# Patient Record
Sex: Female | Born: 1965 | Race: Black or African American | Hispanic: No | Marital: Single | Smoking: Never smoker
Health system: Southern US, Community
[De-identification: ages and names within clinical notes are randomized; demographics above are authoritative.]

## PROBLEM LIST (undated history)

## (undated) DIAGNOSIS — K219 Gastro-esophageal reflux disease without esophagitis: Secondary | ICD-10-CM

## (undated) DIAGNOSIS — I10 Essential (primary) hypertension: Secondary | ICD-10-CM

## (undated) DIAGNOSIS — M1990 Unspecified osteoarthritis, unspecified site: Secondary | ICD-10-CM

## (undated) DIAGNOSIS — K649 Unspecified hemorrhoids: Secondary | ICD-10-CM

## (undated) HISTORY — PX: OTHER SURGICAL HISTORY: SHX169

## (undated) HISTORY — PX: CERVICAL CERCLAGE: SHX1329

## (undated) HISTORY — PX: GASTRIC BYPASS: SHX52

---

## 2020-10-07 ENCOUNTER — Other Ambulatory Visit (HOSPITAL_COMMUNITY): Payer: Self-pay | Admitting: Family Medicine

## 2020-10-07 ENCOUNTER — Other Ambulatory Visit (HOSPITAL_COMMUNITY): Payer: Self-pay | Admitting: Internal Medicine

## 2020-10-07 DIAGNOSIS — M8580 Other specified disorders of bone density and structure, unspecified site: Secondary | ICD-10-CM

## 2020-10-07 DIAGNOSIS — Z1231 Encounter for screening mammogram for malignant neoplasm of breast: Secondary | ICD-10-CM

## 2020-10-07 DIAGNOSIS — Z780 Asymptomatic menopausal state: Secondary | ICD-10-CM

## 2020-10-15 ENCOUNTER — Ambulatory Visit (HOSPITAL_COMMUNITY)
Admission: RE | Admit: 2020-10-15 | Discharge: 2020-10-15 | Disposition: A | Discharge status: Home / Self Care | Payer: Medicare HMO | Source: Ambulatory Visit | Attending: Family Medicine | Admitting: Family Medicine

## 2020-10-15 ENCOUNTER — Other Ambulatory Visit: Payer: Self-pay

## 2020-10-15 DIAGNOSIS — Z13820 Encounter for screening for osteoporosis: Secondary | ICD-10-CM | POA: Insufficient documentation

## 2020-10-15 DIAGNOSIS — Z1231 Encounter for screening mammogram for malignant neoplasm of breast: Secondary | ICD-10-CM | POA: Diagnosis present

## 2020-10-15 DIAGNOSIS — M8580 Other specified disorders of bone density and structure, unspecified site: Secondary | ICD-10-CM

## 2020-10-15 DIAGNOSIS — M8589 Other specified disorders of bone density and structure, multiple sites: Secondary | ICD-10-CM | POA: Insufficient documentation

## 2021-04-06 ENCOUNTER — Encounter (INDEPENDENT_AMBULATORY_CARE_PROVIDER_SITE_OTHER): Payer: Self-pay | Admitting: *Deleted

## 2021-05-13 DIAGNOSIS — Z23 Encounter for immunization: Secondary | ICD-10-CM | POA: Diagnosis not present

## 2021-05-13 DIAGNOSIS — E559 Vitamin D deficiency, unspecified: Secondary | ICD-10-CM | POA: Diagnosis not present

## 2021-05-13 DIAGNOSIS — K623 Rectal prolapse: Secondary | ICD-10-CM | POA: Diagnosis not present

## 2021-05-25 DIAGNOSIS — R198 Other specified symptoms and signs involving the digestive system and abdomen: Secondary | ICD-10-CM | POA: Diagnosis not present

## 2021-05-25 DIAGNOSIS — K644 Residual hemorrhoidal skin tags: Secondary | ICD-10-CM | POA: Diagnosis not present

## 2021-05-26 DIAGNOSIS — M25562 Pain in left knee: Secondary | ICD-10-CM | POA: Diagnosis not present

## 2021-05-26 DIAGNOSIS — M25561 Pain in right knee: Secondary | ICD-10-CM | POA: Diagnosis not present

## 2021-05-26 DIAGNOSIS — G8929 Other chronic pain: Secondary | ICD-10-CM | POA: Diagnosis not present

## 2021-07-02 DIAGNOSIS — Z79891 Long term (current) use of opiate analgesic: Secondary | ICD-10-CM | POA: Diagnosis not present

## 2021-07-02 DIAGNOSIS — M5416 Radiculopathy, lumbar region: Secondary | ICD-10-CM | POA: Diagnosis not present

## 2021-07-07 DIAGNOSIS — Z9884 Bariatric surgery status: Secondary | ICD-10-CM | POA: Diagnosis not present

## 2021-07-07 DIAGNOSIS — K529 Noninfective gastroenteritis and colitis, unspecified: Secondary | ICD-10-CM | POA: Diagnosis not present

## 2021-07-07 DIAGNOSIS — K6289 Other specified diseases of anus and rectum: Secondary | ICD-10-CM | POA: Diagnosis not present

## 2021-07-07 DIAGNOSIS — K641 Second degree hemorrhoids: Secondary | ICD-10-CM | POA: Diagnosis not present

## 2021-07-16 DIAGNOSIS — R2232 Localized swelling, mass and lump, left upper limb: Secondary | ICD-10-CM | POA: Diagnosis not present

## 2021-07-16 DIAGNOSIS — R0989 Other specified symptoms and signs involving the circulatory and respiratory systems: Secondary | ICD-10-CM | POA: Diagnosis not present

## 2021-07-16 DIAGNOSIS — J069 Acute upper respiratory infection, unspecified: Secondary | ICD-10-CM | POA: Diagnosis not present

## 2021-07-16 DIAGNOSIS — Z760 Encounter for issue of repeat prescription: Secondary | ICD-10-CM | POA: Diagnosis not present

## 2021-08-11 DIAGNOSIS — H16223 Keratoconjunctivitis sicca, not specified as Sjogren's, bilateral: Secondary | ICD-10-CM | POA: Diagnosis not present

## 2021-08-11 DIAGNOSIS — H524 Presbyopia: Secondary | ICD-10-CM | POA: Diagnosis not present

## 2021-08-11 DIAGNOSIS — H2513 Age-related nuclear cataract, bilateral: Secondary | ICD-10-CM | POA: Diagnosis not present

## 2021-08-12 ENCOUNTER — Encounter (INDEPENDENT_AMBULATORY_CARE_PROVIDER_SITE_OTHER): Payer: Self-pay | Admitting: Gastroenterology

## 2021-08-12 ENCOUNTER — Telehealth (INDEPENDENT_AMBULATORY_CARE_PROVIDER_SITE_OTHER): Payer: Self-pay

## 2021-08-12 ENCOUNTER — Other Ambulatory Visit: Payer: Self-pay

## 2021-08-12 ENCOUNTER — Ambulatory Visit (INDEPENDENT_AMBULATORY_CARE_PROVIDER_SITE_OTHER): Payer: Medicare Other | Admitting: Gastroenterology

## 2021-08-12 DIAGNOSIS — K529 Noninfective gastroenteritis and colitis, unspecified: Secondary | ICD-10-CM | POA: Diagnosis not present

## 2021-08-12 DIAGNOSIS — R1013 Epigastric pain: Secondary | ICD-10-CM | POA: Diagnosis not present

## 2021-08-12 DIAGNOSIS — K860 Alcohol-induced chronic pancreatitis: Secondary | ICD-10-CM

## 2021-08-12 DIAGNOSIS — G8929 Other chronic pain: Secondary | ICD-10-CM | POA: Insufficient documentation

## 2021-08-12 DIAGNOSIS — K6289 Other specified diseases of anus and rectum: Secondary | ICD-10-CM | POA: Insufficient documentation

## 2021-08-12 DIAGNOSIS — K861 Other chronic pancreatitis: Secondary | ICD-10-CM | POA: Insufficient documentation

## 2021-08-12 MED ORDER — NA SULFATE-K SULFATE-MG SULF 17.5-3.13-1.6 GM/177ML PO SOLN: 1.0000 | ORAL | 0 refills | Status: DC

## 2021-08-12 MED ORDER — DICYCLOMINE HCL 10 MG PO CAPS: 10.0000 mg | ORAL_CAPSULE | Freq: Three times a day (TID) | ORAL | 1 refills | Status: AC | PRN

## 2021-08-12 NOTE — Telephone Encounter (Signed)
LeighAnn Kamaury Cutbirth, CMA  

## 2021-08-12 NOTE — Progress Notes (Signed)
[name], M.D. Gastroenterology & Hepatology [facility] [address] [place], [place] [address] Primary Care Physician: Vivien Rota, MD [place] [id number]  Referring MD: PCP  Chief Complaint: Diarrhea, abdominal pain and bloating.  History of Present Illness: April Kidd is a 55 y.o. female with PMH radiculopathy, obseitys /p RYGB in 2003 history of chronic pancreatitis, chronic back pain, GERD, hypertension, osteoporosis, who presents for evaluation of diarrhea, abdominal pain and bloating.  Patient is a poor historian.  She states for the last 2 years she has presented recurrent diarrhea.  Reports prior to the onset of diarrhea, she had constipation and had a bowel movement 3-4 times a week - she was taking tramadol at that time and another opiate.She reports hen her diarrhea started she was having 5-10 bowel movements per day. She states that around the same time she started recurrent episodes of abdominal cramping in her upper abdomen. She states having frequent soiling episodes while sleeping in the past which made her concerned. States that 2 months ago her diarrhea slowed down. She states that more recently she noticed she had to strain to move her bowels. Sometimes she had to do manual maneuvers to remove her stool. She was advised to start taking Miralax by her PCP , but she took it for a short time as she noticed her bowel movements were moving more frequently after this. She is currently having at least 2 bowel movements per day. Patient reports that if she eats certain type of fruits like grapes, apples, citrussy fruits, sweet food or lactose contianing food, she can have diarrhea, so she tries to avoid this type of food.She has not presented any more episodes of fecal incontinence. She states that her most significant symptom of the moment is that she is currently presenting  recurrent pain in the rectal area, which improves with the intake of tramadol,  gabapentin and tylenol. The patient states at least a year ago she was presenting pressure in the rectum but these became more severe as she has presented pain.   Patient reports that she was vomiting frequently but this has improved recently.  She is still having some episodes of abdominal cramping in the epigastric area. Does not take any medications for this.  Patient states she was on ibuprofen a a couple of months ago but has not tried it recently.  The patient denies having any fever, chills, hematochezia, melena, hematemesis, jaundice, pruritus. Patient went down to 198 lb but gained some lb recently.  Only, as part of the investigation she had performed in the past for her symptoms she underwent CT abdomen pelvis with IV contrast on 11/22/2020 which showed the following abnormalities 1. Hazy stranding involving the subcutaneous fat of the lower  ventral pelvis, of uncertain significance, but could reflect changes  of acute panniculitis. Correlation with physical exam and any  potential symptomatology recommended. No loculated collections.  2. No other acute intra-abdominal or pelvic process.  3. 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  4. Sequelae of chronic pancreatitis.  5. Status post cholecystectomy. Mild intra and extrahepatic biliary  dilatation could be related to post cholecystectomy changes.  Correlation with LFTs suggested.    Last EGD:2003 -does not recall findings. Last Colonoscopy: 2018 - patient reports it was normal, performed at [place], no records available  FHx: neg for any gastrointestinal/liver disease, mother had cancer but unknown type Social: neg smoking, or illicit  drug use. Used to drink heavily yuntul 2013, drank liquor 5 drinks per day Surgical: RYGB, c section  Past Medical History:History reviewed. No pertinent past medical  history.  Past Surgical History:History reviewed. No pertinent surgical history.  Family History:History reviewed. No pertinent family history.  Social History: Social History   Tobacco Use  Smoking Status Never  Smokeless Tobacco Never   Social History   Substance and Sexual Activity  Alcohol Use Not Currently   Social History   Substance and Sexual Activity  Drug Use Never    Allergies: Allergies  Allergen Reactions   Bactrim [Sulfamethoxazole-Trimethoprim] Shortness Of Breath   Shellfish Allergy Anaphylaxis   Iodides Itching   Latex Itching   Morphine And Related Itching   Sulfa Antibiotics Itching    Medications: Current Outpatient Medications  Medication Sig Dispense Refill   alendronate (FOSAMAX) 70 MG tablet Take 70 mg by mouth once a week. Take with a full glass of water on an empty stomach.     amLODipine (NORVASC) 5 MG tablet Take 5 mg by mouth daily.     aspirin EC 81 MG tablet Take 81 mg by mouth daily. Swallow whole.     atorvastatin (LIPITOR) 20 MG tablet Take 20 mg by mouth at bedtime.     cholecalciferol (VITAMIN D3) 25 MCG (1000 UNIT) tablet Take 1,000 Units by mouth daily.     clotrimazole (LOTRIMIN) 1 % cream Apply 1 application topically 2 (two) times daily.     conjugated estrogens (PREMARIN) vaginal cream Place 1 applicator vaginally. Two times per week.     cyclobenzaprine (FLEXERIL) 5 MG tablet Take 5 mg by mouth 3 (three) times daily as needed for muscle spasms.     gabapentin (NEURONTIN) 300 MG capsule Take 300 mg by mouth 3 (three) times daily.     metoprolol tartrate (LOPRESSOR) 25 MG tablet Take 25 mg by mouth 2 (two) times daily.     omeprazole (PRILOSEC) 20 MG capsule Take 20 mg by mouth daily.     traMADol (ULTRAM) 50 MG tablet Take by mouth 4 (four) times daily.     valACYclovir (VALTREX) 500 MG tablet Take 500 mg by mouth as needed.     No current facility-administered medications for this visit.    Review of Systems: GENERAL:  negative for malaise, night sweats HEENT: No changes in hearing or vision, no nose bleeds or other nasal problems. NECK: Negative for lumps, goiter, pain and significant neck swelling RESPIRATORY: Negative for cough, wheezing CARDIOVASCULAR: Negative for chest pain, leg swelling, palpitations, orthopnea GI: SEE HPI MUSCULOSKELETAL: Negative for joint pain or swelling, back pain, and muscle pain. SKIN: Negative for lesions, rash PSYCH: Negative for sleep disturbance, mood disorder and recent psychosocial stressors. HEMATOLOGY Negative for prolonged bleeding, bruising easily, and swollen nodes. ENDOCRINE: Negative for cold or heat intolerance, polyuria, polydipsia and goiter. NEURO: negative for tremor, gait imbalance, syncope and seizures. The remainder of the review of systems is noncontributory.   Physical Exam: BP 120/84 (BP Location: Left Arm, Patient Position: Sitting, Cuff Size: Large)   Pulse 84   Temp 98.5 F (36.9 C) (Oral)   Ht 5\' 4"  (1.626 m)   Wt 202 lb 11.2 oz (91.9 kg)   BMI 34.79 kg/m  GENERAL: The patient is AO x3, in no acute distress. HEENT: Head is normocephalic and atraumatic. EOMI are intact. Mouth is well hydrated and without lesions. NECK: Supple. No masses LUNGS: Clear to auscultation. No presence of rhonchi/wheezing/rales. Adequate chest expansion HEART:  RRR, normal s1 and s2. ABDOMEN: tender to palpation in the upper abdomen, no guarding, no peritoneal signs, and nondistended. BS +. No masses. EXTREMITIES: Without any cyanosis, clubbing, rash, lesions or edema. NEUROLOGIC: AOx3, no focal motor deficit. SKIN: no jaundice, no rashes   Imaging/Labs: as above  I personally reviewed and interpreted the available labs, imaging and endoscopic files.  Impression and Plan: April Kidd is a 55 y.o. female with PMH radiculopathy, obseitys /p RYGB in 2003 history of chronic pancreatitis, chronic back pain, GERD, hypertension, osteoporosis, who presents  for evaluation of diarrhea, abdominal pain and bloating.  Patient has presented chronic symptoms which mainly characterized by diarrhea but her stool frequency has Baright 3 times.  She had some significant weight loss in the past but this has stabilized.  Is unclear why she has presented these episodes diarrhea although so far the work-up suggest there may be a component of alcohol induced chronic pancreatitis given the CT scan findings that could explain her diarrhea and abdominal pain.  We will confirm if she indeed has some absorption with fecal elastase and fecal fat, if abnormal we will start her on a trial with pancreatic enzymes.  We will also evaluate for other organic etiologies with blood testing and stool testing for infectious causes.  For now, she may benefit from taking Bentyl as needed and will need to avoid taking any NSAIDs for now.  Finally, we will further investigate her symptoms an EGD and a colonoscopy, especially in the setting of severe rectal pain and recurrent episodes of abdominal pain of unclear etiology.  - Schedule EGD and colonoscopy with random small bowel and colonic biopsies. - Start Bentyl 1 tablet q12h as needed for abdominal pain - Check CBC, CMP, TSH. celiac disease panel, C. Diff, GI path, O/P in stool, fecal elastase/fat  All questions were answered.      [name], MD Gastroenterology and Hepatology [facility]

## 2021-08-12 NOTE — H&P (View-Only) (Signed)
[name], M.D. Gastroenterology & Hepatology [facility] [address] [place], [place] [address] Primary Care Physician: Vivien Rota, MD [place] [id number]  Referring MD: PCP  Chief Complaint: Diarrhea, abdominal pain and bloating.  History of Present Illness: Wealthy Abiola Kuehnle is a 55 y.o. female with PMH radiculopathy, obseitys /p RYGB in 2003 history of chronic pancreatitis, chronic back pain, GERD, hypertension, osteoporosis, who presents for evaluation of diarrhea, abdominal pain and bloating.  Patient is a poor historian.  She states for the last 2 years she has presented recurrent diarrhea.  Reports prior to the onset of diarrhea, she had constipation and had a bowel movement 3-4 times a week - she was taking tramadol at that time and another opiate.She reports hen her diarrhea started she was having 5-10 bowel movements per day. She states that around the same time she started recurrent episodes of abdominal cramping in her upper abdomen. She states having frequent soiling episodes while sleeping in the past which made her concerned. States that 2 months ago her diarrhea slowed down. She states that more recently she noticed she had to strain to move her bowels. Sometimes she had to do manual maneuvers to remove her stool. She was advised to start taking Miralax by her PCP , but she took it for a short time as she noticed her bowel movements were moving more frequently after this. She is currently having at least 2 bowel movements per day. Patient reports that if she eats certain type of fruits like grapes, apples, citrussy fruits, sweet food or lactose contianing food, she can have diarrhea, so she tries to avoid this type of food.She has not presented any more episodes of fecal incontinence. She states that her most significant symptom of the moment is that she is currently presenting  recurrent pain in the rectal area, which improves with the intake of tramadol,  gabapentin and tylenol. The patient states at least a year ago she was presenting pressure in the rectum but these became more severe as she has presented pain.   Patient reports that she was vomiting frequently but this has improved recently.  She is still having some episodes of abdominal cramping in the epigastric area. Does not take any medications for this.  Patient states she was on ibuprofen a a couple of months ago but has not tried it recently.  The patient denies having any fever, chills, hematochezia, melena, hematemesis, jaundice, pruritus. Patient went down to 198 lb but gained some lb recently.  Only, as part of the investigation she had performed in the past for her symptoms she underwent CT abdomen pelvis with IV contrast on 11/22/2020 which showed the following abnormalities 1. Hazy stranding involving the subcutaneous fat of the lower  ventral pelvis, of uncertain significance, but could reflect changes  of acute panniculitis. Correlation with physical exam and any  potential symptomatology recommended. No loculated collections.  2. No other acute intra-abdominal or pelvic process.  3. 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  4. Sequelae of chronic pancreatitis.  5. Status post cholecystectomy. Mild intra and extrahepatic biliary  dilatation could be related to post cholecystectomy changes.  Correlation with LFTs suggested.    Last EGD:2003 -does not recall findings. Last Colonoscopy: 2018 - patient reports it was normal, performed at [place], no records available  FHx: neg for any gastrointestinal/liver disease, mother had cancer but unknown type Social: neg smoking, or illicit  drug use. Used to drink heavily yuntul 2013, drank liquor 5 drinks per day Surgical: RYGB, c section  Past Medical History:History reviewed. No pertinent past medical  history.  Past Surgical History:History reviewed. No pertinent surgical history.  Family History:History reviewed. No pertinent family history.  Social History: Social History   Tobacco Use  Smoking Status Never  Smokeless Tobacco Never   Social History   Substance and Sexual Activity  Alcohol Use Not Currently   Social History   Substance and Sexual Activity  Drug Use Never    Allergies: Allergies  Allergen Reactions   Bactrim [Sulfamethoxazole-Trimethoprim] Shortness Of Breath   Shellfish Allergy Anaphylaxis   Iodides Itching   Latex Itching   Morphine And Related Itching   Sulfa Antibiotics Itching    Medications: Current Outpatient Medications  Medication Sig Dispense Refill   alendronate (FOSAMAX) 70 MG tablet Take 70 mg by mouth once a week. Take with a full glass of water on an empty stomach.     amLODipine (NORVASC) 5 MG tablet Take 5 mg by mouth daily.     aspirin EC 81 MG tablet Take 81 mg by mouth daily. Swallow whole.     atorvastatin (LIPITOR) 20 MG tablet Take 20 mg by mouth at bedtime.     cholecalciferol (VITAMIN D3) 25 MCG (1000 UNIT) tablet Take 1,000 Units by mouth daily.     clotrimazole (LOTRIMIN) 1 % cream Apply 1 application topically 2 (two) times daily.     conjugated estrogens (PREMARIN) vaginal cream Place 1 applicator vaginally. Two times per week.     cyclobenzaprine (FLEXERIL) 5 MG tablet Take 5 mg by mouth 3 (three) times daily as needed for muscle spasms.     gabapentin (NEURONTIN) 300 MG capsule Take 300 mg by mouth 3 (three) times daily.     metoprolol tartrate (LOPRESSOR) 25 MG tablet Take 25 mg by mouth 2 (two) times daily.     omeprazole (PRILOSEC) 20 MG capsule Take 20 mg by mouth daily.     traMADol (ULTRAM) 50 MG tablet Take by mouth 4 (four) times daily.     valACYclovir (VALTREX) 500 MG tablet Take 500 mg by mouth as needed.     No current facility-administered medications for this visit.    Review of Systems: GENERAL:  negative for malaise, night sweats HEENT: No changes in hearing or vision, no nose bleeds or other nasal problems. NECK: Negative for lumps, goiter, pain and significant neck swelling RESPIRATORY: Negative for cough, wheezing CARDIOVASCULAR: Negative for chest pain, leg swelling, palpitations, orthopnea GI: SEE HPI MUSCULOSKELETAL: Negative for joint pain or swelling, back pain, and muscle pain. SKIN: Negative for lesions, rash PSYCH: Negative for sleep disturbance, mood disorder and recent psychosocial stressors. HEMATOLOGY Negative for prolonged bleeding, bruising easily, and swollen nodes. ENDOCRINE: Negative for cold or heat intolerance, polyuria, polydipsia and goiter. NEURO: negative for tremor, gait imbalance, syncope and seizures. The remainder of the review of systems is noncontributory.   Physical Exam: BP 120/84 (BP Location: Left Arm, Patient Position: Sitting, Cuff Size: Large)    Pulse 84    Temp 98.5 F (36.9 C) (Oral)    Ht 5\' 4"  (1.626 m)    Wt 202 lb 11.2 oz (91.9 kg)    BMI 34.79 kg/m  GENERAL: The patient is AO x3, in no acute distress. HEENT: Head is normocephalic and atraumatic. EOMI are intact. Mouth is well hydrated and without lesions. NECK: Supple. No masses LUNGS: Clear to auscultation. No presence of  rhonchi/wheezing/rales. Adequate chest expansion HEART: RRR, normal s1 and s2. ABDOMEN: tender to palpation in the upper abdomen, no guarding, no peritoneal signs, and nondistended. BS +. No masses. EXTREMITIES: Without any cyanosis, clubbing, rash, lesions or edema. NEUROLOGIC: AOx3, no focal motor deficit. SKIN: no jaundice, no rashes   Imaging/Labs: as above  I personally reviewed and interpreted the available labs, imaging and endoscopic files.  Impression and Plan: Priscillia Joshlyn Schimmoeller is a 55 y.o. female with PMH radiculopathy, obseitys /p RYGB in 2003 history of chronic pancreatitis, chronic back pain, GERD, hypertension, osteoporosis, who presents  for evaluation of diarrhea, abdominal pain and bloating.  Patient has presented chronic symptoms which mainly characterized by diarrhea but her stool frequency has Baright 3 times.  She had some significant weight loss in the past but this has stabilized.  Is unclear why she has presented these episodes diarrhea although so far the work-up suggest there may be a component of alcohol induced chronic pancreatitis given the CT scan findings that could explain her diarrhea and abdominal pain.  We will confirm if she indeed has some absorption with fecal elastase and fecal fat, if abnormal we will start her on a trial with pancreatic enzymes.  We will also evaluate for other organic etiologies with blood testing and stool testing for infectious causes.  For now, she may benefit from taking Bentyl as needed and will need to avoid taking any NSAIDs for now.  Finally, we will further investigate her symptoms an EGD and a colonoscopy, especially in the setting of severe rectal pain and recurrent episodes of abdominal pain of unclear etiology.  - Schedule EGD and colonoscopy with random small bowel and colonic biopsies. - Start Bentyl 1 tablet q12h as needed for abdominal pain - Check CBC, CMP, TSH. celiac disease panel, C. Diff, GI path, O/P in stool, fecal elastase/fat  All questions were answered.      [name], MD Gastroenterology and Hepatology [facility]

## 2021-08-12 NOTE — Patient Instructions (Signed)
Schedule EGD and colonoscopy Start Bentyl 1 tablet q12h as needed for abdominal pain Perform blood and stool workup

## 2021-08-13 ENCOUNTER — Encounter (INDEPENDENT_AMBULATORY_CARE_PROVIDER_SITE_OTHER): Payer: Self-pay

## 2021-08-13 ENCOUNTER — Other Ambulatory Visit (INDEPENDENT_AMBULATORY_CARE_PROVIDER_SITE_OTHER): Payer: Self-pay

## 2021-08-20 DIAGNOSIS — R1013 Epigastric pain: Secondary | ICD-10-CM | POA: Diagnosis not present

## 2021-08-20 DIAGNOSIS — G8929 Other chronic pain: Secondary | ICD-10-CM | POA: Diagnosis not present

## 2021-08-20 DIAGNOSIS — K861 Other chronic pancreatitis: Secondary | ICD-10-CM | POA: Diagnosis not present

## 2021-08-20 DIAGNOSIS — K529 Noninfective gastroenteritis and colitis, unspecified: Secondary | ICD-10-CM | POA: Diagnosis not present

## 2021-08-20 DIAGNOSIS — K6289 Other specified diseases of anus and rectum: Secondary | ICD-10-CM | POA: Diagnosis not present

## 2021-08-23 LAB — CBC WITH DIFFERENTIAL/PLATELET
Absolute Monocytes: 348 cells/uL (ref 200–950)
Basophils Absolute: 8 cells/uL (ref 0–200)
Basophils Relative: 0.2 %
Eosinophils Absolute: 68 cells/uL (ref 15–500)
Eosinophils Relative: 1.7 %
HCT: 38.3 % (ref 35.0–45.0)
Hemoglobin: 12.5 g/dL (ref 11.7–15.5)
Lymphs Abs: 1384 cells/uL (ref 850–3900)
MCH: 28.2 pg (ref 27.0–33.0)
MCHC: 32.6 g/dL (ref 32.0–36.0)
MCV: 86.3 fL (ref 80.0–100.0)
MPV: 13.2 fL — ABNORMAL HIGH (ref 7.5–12.5)
Monocytes Relative: 8.7 %
Neutro Abs: 2192 cells/uL (ref 1500–7800)
Neutrophils Relative %: 54.8 %
Platelets: 136 10*3/uL — ABNORMAL LOW (ref 140–400)
RBC: 4.44 10*6/uL (ref 3.80–5.10)
RDW: 12.8 % (ref 11.0–15.0)
Total Lymphocyte: 34.6 %
WBC: 4 10*3/uL (ref 3.8–10.8)

## 2021-08-23 LAB — COMPREHENSIVE METABOLIC PANEL
AG Ratio: 1.4 (calc) (ref 1.0–2.5)
ALT: 10 U/L (ref 6–29)
AST: 17 U/L (ref 10–35)
Albumin: 3.6 g/dL (ref 3.6–5.1)
Alkaline phosphatase (APISO): 81 U/L (ref 37–153)
BUN: 11 mg/dL (ref 7–25)
CO2: 27 mmol/L (ref 20–32)
Calcium: 8.5 mg/dL — ABNORMAL LOW (ref 8.6–10.4)
Chloride: 106 mmol/L (ref 98–110)
Creat: 0.65 mg/dL (ref 0.50–1.03)
Globulin: 2.5 g/dL (calc) (ref 1.9–3.7)
Glucose, Bld: 79 mg/dL (ref 65–139)
Potassium: 3.7 mmol/L (ref 3.5–5.3)
Sodium: 141 mmol/L (ref 135–146)
Total Bilirubin: 0.5 mg/dL (ref 0.2–1.2)
Total Protein: 6.1 g/dL (ref 6.1–8.1)

## 2021-08-23 LAB — TSH: TSH: 2.33 mIU/L

## 2021-08-23 LAB — CELIAC DISEASE PANEL
(tTG) Ab, IgA: <1 U/mL
(tTG) Ab, IgG: 1.3 U/mL
Gliadin IgA: <1 U/mL
Gliadin IgG: <1 U/mL
Immunoglobulin A: 183 mg/dL (ref 47–310)

## 2021-09-01 ENCOUNTER — Encounter (HOSPITAL_COMMUNITY)
Admission: RE | Disposition: A | Discharge status: Home / Self Care | Payer: Self-pay | Source: Ambulatory Visit | Attending: Gastroenterology

## 2021-09-01 ENCOUNTER — Other Ambulatory Visit: Payer: Self-pay

## 2021-09-01 ENCOUNTER — Encounter (HOSPITAL_COMMUNITY): Payer: Self-pay | Admitting: Gastroenterology

## 2021-09-01 ENCOUNTER — Ambulatory Visit (HOSPITAL_COMMUNITY): Payer: Medicare Other | Admitting: Anesthesiology

## 2021-09-01 ENCOUNTER — Other Ambulatory Visit (INDEPENDENT_AMBULATORY_CARE_PROVIDER_SITE_OTHER): Payer: Self-pay | Admitting: Gastroenterology

## 2021-09-01 ENCOUNTER — Ambulatory Visit (HOSPITAL_COMMUNITY)
Admission: RE | Admit: 2021-09-01 | Discharge: 2021-09-01 | Disposition: A | Discharge status: Home / Self Care | Payer: Medicare Other | Source: Ambulatory Visit | Attending: Gastroenterology | Admitting: Gastroenterology

## 2021-09-01 DIAGNOSIS — K648 Other hemorrhoids: Secondary | ICD-10-CM | POA: Insufficient documentation

## 2021-09-01 DIAGNOSIS — K529 Noninfective gastroenteritis and colitis, unspecified: Secondary | ICD-10-CM | POA: Diagnosis not present

## 2021-09-01 DIAGNOSIS — M810 Age-related osteoporosis without current pathological fracture: Secondary | ICD-10-CM | POA: Diagnosis not present

## 2021-09-01 DIAGNOSIS — K6289 Other specified diseases of anus and rectum: Secondary | ICD-10-CM | POA: Diagnosis not present

## 2021-09-01 DIAGNOSIS — K644 Residual hemorrhoidal skin tags: Secondary | ICD-10-CM | POA: Insufficient documentation

## 2021-09-01 DIAGNOSIS — E669 Obesity, unspecified: Secondary | ICD-10-CM | POA: Insufficient documentation

## 2021-09-01 DIAGNOSIS — I10 Essential (primary) hypertension: Secondary | ICD-10-CM | POA: Insufficient documentation

## 2021-09-01 DIAGNOSIS — K861 Other chronic pancreatitis: Secondary | ICD-10-CM | POA: Diagnosis not present

## 2021-09-01 DIAGNOSIS — Z6833 Body mass index (BMI) 33.0-33.9, adult: Secondary | ICD-10-CM | POA: Diagnosis not present

## 2021-09-01 DIAGNOSIS — Z9884 Bariatric surgery status: Secondary | ICD-10-CM | POA: Insufficient documentation

## 2021-09-01 DIAGNOSIS — Z79899 Other long term (current) drug therapy: Secondary | ICD-10-CM | POA: Insufficient documentation

## 2021-09-01 DIAGNOSIS — K5793 Diverticulitis of intestine, part unspecified, without perforation or abscess with bleeding: Secondary | ICD-10-CM | POA: Diagnosis not present

## 2021-09-01 DIAGNOSIS — K219 Gastro-esophageal reflux disease without esophagitis: Secondary | ICD-10-CM | POA: Diagnosis not present

## 2021-09-01 DIAGNOSIS — K5730 Diverticulosis of large intestine without perforation or abscess without bleeding: Secondary | ICD-10-CM | POA: Insufficient documentation

## 2021-09-01 DIAGNOSIS — G8929 Other chronic pain: Secondary | ICD-10-CM | POA: Diagnosis not present

## 2021-09-01 DIAGNOSIS — M5410 Radiculopathy, site unspecified: Secondary | ICD-10-CM | POA: Diagnosis not present

## 2021-09-01 DIAGNOSIS — R109 Unspecified abdominal pain: Secondary | ICD-10-CM | POA: Insufficient documentation

## 2021-09-01 DIAGNOSIS — R1013 Epigastric pain: Secondary | ICD-10-CM

## 2021-09-01 DIAGNOSIS — Z9049 Acquired absence of other specified parts of digestive tract: Secondary | ICD-10-CM | POA: Insufficient documentation

## 2021-09-01 DIAGNOSIS — R1084 Generalized abdominal pain: Secondary | ICD-10-CM | POA: Diagnosis not present

## 2021-09-01 HISTORY — PX: BIOPSY: SHX5522

## 2021-09-01 HISTORY — DX: Unspecified hemorrhoids: K64.9

## 2021-09-01 HISTORY — PX: COLONOSCOPY WITH PROPOFOL: SHX5780

## 2021-09-01 HISTORY — PX: ESOPHAGOGASTRODUODENOSCOPY (EGD) WITH PROPOFOL: SHX5813

## 2021-09-01 HISTORY — DX: Essential (primary) hypertension: I10

## 2021-09-01 HISTORY — DX: Gastro-esophageal reflux disease without esophagitis: K21.9

## 2021-09-01 HISTORY — DX: Unspecified osteoarthritis, unspecified site: M19.90

## 2021-09-01 LAB — HM COLONOSCOPY

## 2021-09-01 SURGERY — COLONOSCOPY WITH PROPOFOL
Anesthesia: General

## 2021-09-01 MED ORDER — EPHEDRINE SULFATE 50 MG/ML IJ SOLN
INTRAMUSCULAR | Status: DC | PRN
  Administered 2021-09-01: 10 mg via INTRAVENOUS

## 2021-09-01 MED ORDER — PROPOFOL 500 MG/50ML IV EMUL
INTRAVENOUS | Status: DC | PRN
  Administered 2021-09-01: 150 ug/kg/min via INTRAVENOUS

## 2021-09-01 MED ORDER — PROPOFOL 10 MG/ML IV BOLUS
INTRAVENOUS | Status: DC | PRN
  Administered 2021-09-01: 20 mg via INTRAVENOUS
  Administered 2021-09-01 (×2): 50 mg via INTRAVENOUS

## 2021-09-01 MED ORDER — LACTATED RINGERS IV SOLN: INTRAVENOUS | Status: DC

## 2021-09-01 NOTE — Transfer of Care (Signed)
Immediate Anesthesia Transfer of Care Note  Patient: April Kidd  Procedure(s) Performed: COLONOSCOPY WITH PROPOFOL ESOPHAGOGASTRODUODENOSCOPY (EGD) WITH PROPOFOL BIOPSY  Patient Location: Short Stay  Anesthesia Type:General  Level of Consciousness: awake  Airway & Oxygen Therapy: Patient Spontanous Breathing  Post-op Assessment: Report given to RN  Post vital signs: Reviewed  Last Vitals:  Vitals Value Taken Time  BP    Temp    Pulse    Resp    SpO2      Last Pain:  Vitals:   09/01/21 1017  TempSrc:   PainSc: 0-No pain      Patients Stated Pain Goal: 9 (09/01/21 0917)  Complications: No notable events documented.

## 2021-09-01 NOTE — Discharge Instructions (Addendum)
You are being discharged to home.  Resume your previous diet.  We are waiting for your pathology results.  Check H. pylori in stool and serology. Your physician has recommended a repeat colonoscopy in 10 years for screening purposes.

## 2021-09-01 NOTE — Op Note (Signed)
[facility] Patient Name: April Kidd Procedure Date: 09/01/2021 10:11 AM MRN: [id number] Date of Birth: 09-10-1965 Attending MD: [name] ,  CSN: [id number] Age: 55 Admit Type: Outpatient Procedure:                Upper GI endoscopy Indications:              Abdominal pain Providers:                [name], Edrick Kins, RN, Dyann Ruddle Referring MD:              Medicines:                Monitored Anesthesia Care Complications:            No immediate complications. Estimated Blood Loss:     Estimated blood loss: none. Procedure:                Pre-Anesthesia Assessment:                           - Prior to the procedure, a History and Physical                            was performed, and patient medications, allergies                            and sensitivities were reviewed. The patient's                            tolerance of previous anesthesia was reviewed.                           - The risks and benefits of the procedure and the                            sedation options and risks were discussed with the                            patient. All questions were answered and informed                            consent was obtained.                           - ASA Grade Assessment: II - A patient with mild                            systemic disease.                           After obtaining informed consent, the endoscope was                            passed under direct vision. Throughout the                            procedure, the patient's blood pressure,  pulse, and                            oxygen saturations were monitored continuously. The                            [id number]-H190 ([id number]) scope was introduced through the                            mouth, and advanced to the efferent jejunal loop.                            The upper GI endoscopy was accomplished without                            difficulty. The patient tolerated the  procedure                            well. Scope In: 10:24:45 AM Scope Out: 10:31:42 AM Total Procedure Duration: 0 hours 6 minutes 57 seconds  Findings:      The examined esophagus was normal.      Evidence of a gastric bypass was found. A gastric pouch with a 8 cm       length from the GE junction to the gastrojejunal anastomosis was found.       The staple line appeared intact. The gastrojejunal anastomosis was       characterized by healthy appearing mucosa. This was traversed. The       pouch-to-jejunum limb was characterized by healthy appearing mucosa. The       jejunojejunal anastomosis was characterized by healthy appearing mucosa.       Biopsies were taken with a cold forceps for Helicobacter pylori testing.      The examined jejunum was normal (both afferent and efferent limbs). Impression:               - Normal esophagus.                           - Gastric bypass with a pouch 8 cm in length and                            intact staple line. Gastrojejunal anastomosis                            characterized by healthy appearing mucosa. Biopsied.                           - Normal examined jejunum. Moderate Sedation:      Per Anesthesia Care Recommendation:           - Discharge patient to home (ambulatory).                           - Resume previous diet.                           - Await pathology results.                           -  Check H. pylori in stool and serology. Procedure Code(s):        --- Professional ---                           [phone], Esophagogastroduodenoscopy, flexible,                            transoral; with biopsy, single or multiple Diagnosis Code(s):        --- Professional ---                           Z98.84, Bariatric surgery status                           R10.9, Unspecified abdominal pain CPT copyright 2019 [employer]. All rights reserved. The codes documented in this report are preliminary and upon coder review may  be  revised to meet current compliance requirements. [name], MD [name],  09/01/2021 10:35:52 AM This report has been signed electronically. Number of Addenda: 0

## 2021-09-01 NOTE — Anesthesia Preprocedure Evaluation (Signed)
Anesthesia Evaluation  Patient identified by MRN, date of birth, ID band Patient awake    Reviewed: Allergy & Precautions, H&P , NPO status , Patient's Chart, lab work & pertinent test results, reviewed documented beta blocker date and time   Airway Mallampati: II  TM Distance: >3 FB Neck ROM: full    Dental no notable dental hx.    Pulmonary neg pulmonary ROS,    Pulmonary exam normal breath sounds clear to auscultation       Cardiovascular Exercise Tolerance: Good hypertension, negative cardio ROS   Rhythm:regular Rate:Normal     Neuro/Psych negative neurological ROS  negative psych ROS   GI/Hepatic Neg liver ROS, GERD  Medicated,  Endo/Other  negative endocrine ROS  Renal/GU negative Renal ROS  negative genitourinary   Musculoskeletal   Abdominal   Peds  Hematology negative hematology ROS (+)   Anesthesia Other Findings   Reproductive/Obstetrics negative OB ROS                             Anesthesia Physical Anesthesia Plan  ASA: 2  Anesthesia Plan: General   Post-op Pain Management:    Induction:   PONV Risk Score and Plan: Propofol infusion  Airway Management Planned:   Additional Equipment:   Intra-op Plan:   Post-operative Plan:   Informed Consent: I have reviewed the patients History and Physical, chart, labs and discussed the procedure including the risks, benefits and alternatives for the proposed anesthesia with the patient or authorized representative who has indicated his/her understanding and acceptance.     Dental Advisory Given  Plan Discussed with: CRNA  Anesthesia Plan Comments:         Anesthesia Quick Evaluation  

## 2021-09-01 NOTE — Anesthesia Postprocedure Evaluation (Signed)
Anesthesia Post Note  Patient: Trulee Hamstra  Procedure(s) Performed: COLONOSCOPY WITH PROPOFOL ESOPHAGOGASTRODUODENOSCOPY (EGD) WITH PROPOFOL BIOPSY  Patient location during evaluation: Endoscopy Anesthesia Type: General Level of consciousness: awake and alert Pain management: pain level controlled Vital Signs Assessment: post-procedure vital signs reviewed and stable Respiratory status: spontaneous breathing Cardiovascular status: blood pressure returned to baseline and stable Postop Assessment: no apparent nausea or vomiting Anesthetic complications: no   No notable events documented.   Last Vitals:  Vitals:   09/01/21 0917  BP: 108/73  Pulse: 69  Resp: 20  Temp: 36.8 C  SpO2: 99%    Last Pain:  Vitals:   09/01/21 1017  TempSrc:   PainSc: 0-No pain                 Brya Simerly

## 2021-09-01 NOTE — Op Note (Signed)
[facility] Patient Name: April Kidd Procedure Date: 09/01/2021 10:10 AM MRN: [id number] Date of Birth: 07/14/1966 Attending MD: [name] ,  CSN: [id number] Age: 55 Admit Type: Outpatient Procedure:                Colonoscopy Indications:              Abdominal pain, Chronic diarrhea, Rectal pain Providers:                [name], Edrick Kins, RN, Dyann Ruddle Referring MD:              Medicines:                Monitored Anesthesia Care Complications:            No immediate complications. Estimated Blood Loss:     Estimated blood loss: none. Procedure:                Pre-Anesthesia Assessment:                           - Prior to the procedure, a History and Physical                            was performed, and patient medications, allergies                            and sensitivities were reviewed. The patient's                            tolerance of previous anesthesia was reviewed.                           - The risks and benefits of the procedure and the                            sedation options and risks were discussed with the                            patient. All questions were answered and informed                            consent was obtained.                           - ASA Grade Assessment: II - A patient with mild                            systemic disease.                           After obtaining informed consent, the colonoscope                            was passed under direct vision. Throughout the                            procedure, the patient's  blood pressure, pulse, and                            oxygen saturations were monitored continuously. The                            [id number]-[id number]) scope was introduced through                            the anus and advanced to the the cecum, identified                            by appendiceal orifice and ileocecal valve. The                            colonoscopy  was performed without difficulty. The                            patient tolerated the procedure well. The quality                            of the bowel preparation was good. Scope In: 10:37:17 AM Scope Out: 10:56:57 AM Scope Withdrawal Time: 0 hours 15 minutes 34 seconds  Total Procedure Duration: 0 hours 19 minutes 40 seconds  Findings:      Hemorrhoids were found on perianal exam.      A few medium-mouthed diverticula were found in the ascending colon.       Biopsies for histology were taken with a cold forceps from the right       colon and left colon for evaluation of microscopic colitis.      Non-bleeding external internal hemorrhoids were found during       retroflexion. The hemorrhoids were medium-sized. Impression:               - Hemorrhoids found on perianal exam.                           - Diverticulosis in the ascending colon. Biopsied.                           - Non-bleeding external internal hemorrhoids. Moderate Sedation:      Per Anesthesia Care Recommendation:           - Discharge patient to home (ambulatory).                           - Resume previous diet.                           - Await pathology results.                           - Repeat colonoscopy in 10 years for screening                            purposes. Procedure Code(s):        --- Professional ---  45380, Colonoscopy, flexible; with biopsy, single                            or multiple Diagnosis Code(s):        --- Professional ---                           K64.4, Residual hemorrhoidal skin tags                           K64.8, Other hemorrhoids                           R10.9, Unspecified abdominal pain                           K52.9, Noninfective gastroenteritis and colitis,                            unspecified                           K62.89, Other specified diseases of anus and rectum                           K57.30, Diverticulosis of large intestine without                             perforation or abscess without bleeding CPT copyright 2019 [employer]. All rights reserved. The codes documented in this report are preliminary and upon coder review may  be revised to meet current compliance requirements. [name], MD [name],  09/01/2021 11:02:48 AM This report has been signed electronically. Number of Addenda: 0

## 2021-09-01 NOTE — Interval H&P Note (Signed)
History and Physical Interval Note:  09/01/2021 9:29 AM  April Kidd  has presented today for surgery, with the diagnosis of Upper Abdominal Pain Rectal Pain.  The various methods of treatment have been discussed with the patient and family. After consideration of risks, benefits and other options for treatment, the patient has consented to  Procedure(s) with comments: COLONOSCOPY WITH PROPOFOL (N/A) - 10;45 ESOPHAGOGASTRODUODENOSCOPY (EGD) WITH PROPOFOL (N/A) as a surgical intervention.  The patient's history has been reviewed, patient examined, no change in status, stable for surgery.  I have reviewed the patient's chart and labs.  Questions were answered to the patient's satisfaction.     [name] [place]

## 2021-09-02 LAB — H. PYLORI ANTIBODY, IGG: H Pylori IgG: 0.49 Index Value (ref 0.00–0.79)

## 2021-09-02 LAB — SURGICAL PATHOLOGY

## 2021-09-03 ENCOUNTER — Encounter (HOSPITAL_COMMUNITY): Payer: Self-pay | Admitting: Gastroenterology

## 2021-09-03 ENCOUNTER — Encounter (INDEPENDENT_AMBULATORY_CARE_PROVIDER_SITE_OTHER): Payer: Self-pay | Admitting: *Deleted

## 2021-09-04 LAB — GASTROINTESTINAL PATHOGEN PANEL PCR
C. difficile Tox A/B, PCR: NOT DETECTED
Campylobacter, PCR: NOT DETECTED
Cryptosporidium, PCR: NOT DETECTED
E coli (ETEC) LT/ST PCR: NOT DETECTED
E coli (STEC) stx1/stx2, PCR: NOT DETECTED
E coli 0157, PCR: NOT DETECTED
Giardia lamblia, PCR: NOT DETECTED
Norovirus, PCR: NOT DETECTED
Rotavirus A, PCR: NOT DETECTED
Salmonella, PCR: NOT DETECTED
Shigella, PCR: NOT DETECTED

## 2021-09-04 LAB — FECAL FAT, QUALITATIVE: FECAL FAT, QUALITATIVE: NORMAL

## 2021-09-04 LAB — OVA AND PARASITE EXAMINATION
CONCENTRATE RESULT:: NONE SEEN
MICRO NUMBER:: 12796152
SPECIMEN QUALITY:: ADEQUATE
TRICHROME RESULT:: NONE SEEN

## 2021-09-04 LAB — PANCREATIC ELASTASE, FECAL: Pancreatic Elastase-1, Stool: 299 mcg/g

## 2021-09-07 DIAGNOSIS — H2512 Age-related nuclear cataract, left eye: Secondary | ICD-10-CM | POA: Diagnosis not present

## 2021-09-07 DIAGNOSIS — Z01818 Encounter for other preprocedural examination: Secondary | ICD-10-CM | POA: Diagnosis not present

## 2021-09-07 DIAGNOSIS — H2511 Age-related nuclear cataract, right eye: Secondary | ICD-10-CM | POA: Diagnosis not present

## 2021-09-15 DIAGNOSIS — H2512 Age-related nuclear cataract, left eye: Secondary | ICD-10-CM | POA: Diagnosis not present

## 2021-09-29 DIAGNOSIS — H2511 Age-related nuclear cataract, right eye: Secondary | ICD-10-CM | POA: Diagnosis not present

## 2021-11-11 DIAGNOSIS — R221 Localized swelling, mass and lump, neck: Secondary | ICD-10-CM | POA: Diagnosis not present

## 2021-11-18 DIAGNOSIS — M170 Bilateral primary osteoarthritis of knee: Secondary | ICD-10-CM | POA: Diagnosis not present

## 2021-11-23 DIAGNOSIS — M79671 Pain in right foot: Secondary | ICD-10-CM | POA: Diagnosis not present

## 2021-11-23 DIAGNOSIS — M722 Plantar fascial fibromatosis: Secondary | ICD-10-CM | POA: Diagnosis not present

## 2021-11-23 DIAGNOSIS — M79672 Pain in left foot: Secondary | ICD-10-CM | POA: Diagnosis not present

## 2021-11-29 ENCOUNTER — Ambulatory Visit (INDEPENDENT_AMBULATORY_CARE_PROVIDER_SITE_OTHER): Payer: Medicare Other | Admitting: Gastroenterology

## 2021-11-29 ENCOUNTER — Other Ambulatory Visit: Payer: Self-pay

## 2021-11-29 ENCOUNTER — Encounter (INDEPENDENT_AMBULATORY_CARE_PROVIDER_SITE_OTHER): Payer: Self-pay | Admitting: Gastroenterology

## 2021-11-29 VITALS — BP 113/77 | HR 76 | Temp 98.2°F | Ht 64.0 in | Wt 210.1 lb

## 2021-11-29 DIAGNOSIS — R109 Unspecified abdominal pain: Secondary | ICD-10-CM | POA: Diagnosis not present

## 2021-11-29 DIAGNOSIS — K860 Alcohol-induced chronic pancreatitis: Secondary | ICD-10-CM | POA: Diagnosis not present

## 2021-11-29 DIAGNOSIS — G8929 Other chronic pain: Secondary | ICD-10-CM

## 2021-11-29 DIAGNOSIS — K6289 Other specified diseases of anus and rectum: Secondary | ICD-10-CM | POA: Diagnosis not present

## 2021-11-29 MED ORDER — SERTRALINE HCL 25 MG PO TABS: 25.0000 mg | ORAL_TABLET | Freq: Every day | ORAL | 1 refills | Status: AC

## 2021-11-29 NOTE — Patient Instructions (Signed)
Start sertraline 25 mg qday ?Can start Imodium as needed if presenting more than 3 Bms per day ?

## 2021-11-29 NOTE — Progress Notes (Addendum)
April Kidd, M.D. ?Gastroenterology & Hepatology ?[facility] ?[address] ?[place], [place] [address] ? ?Primary Care Physician: ?Beatrix Fetters, MD ?[address] ?[place] [address] ? ?I will communicate my assessment and recommendations to the referring MD via EMR. ? ?Problems: ?Rectal pain and chronic abdominal pain ?Chronic pancreatitis ? ?History of Present Illness: ?April Kidd is a 56 y.o. female with PMH radiculopathy, obseitys /p RYGB in 2003, history of chronic pancreatitis due to chronic alcohol intake, chronic back pain, GERD, hypertension, osteoporosis, who presents for follow up of rectal pain and chronic abdominal pain ? ?The patient was last seen on 14 Jan 2021. At that time, the patient Had blood work-up performed that showed mild thrombocytopenia with rest normal CBC, CMP, TSH, celiac disease panel, negative C. difficile, GI panel and ova parasite, normal elastase and fecal fat.Patient underwent an EGD and colonoscopy on 09/01/2021.  EGD showed presence of normal esophagus, gastric bypass with normal anastomosis, biopsies were negative for H. pylori, normal jejunal limbs.  Colonoscopy showed diverticulosis with negative biopsies for microscopic colitis, presence of internal and external hemorrhoids. ? ?Patient states that she has felt persistent significant burning sensation in her abdomen that has not improved with use of dicyclomine and PPI. She states "the burning sensation" is present all the time and it "is present in the whole belly and goes down to the rectum, but also to the spine". She has also presented pain in her upper abdomen which she states it is described as a soreness in her epigastric area that gets worse when clothes are a little tight. She also describes " a flame like pain in her middle abdomen". Pain has also been getting worse when leaning on her left side. She has occasionally nausea and can have vomiting  events. Diarrhea has improved , but she has to strain to move her bowels sometimes, although the stool is soft usually but has 1 bowel movement daily. ? ?The patient denies having any fever, chills, hematochezia, melena, hematemesis, abdominal distention, jaundice, pruritus or weight loss. ? ?Last EGD: As above ?Last Colonoscopy: As above ? ?Past Medical History: ?Past Medical History:  ?Diagnosis Date  ? Arthritis   ? GERD (gastroesophageal reflux disease)   ? Hemorrhoids   ? Hypertension   ? ? ?Past Surgical History: ?Past Surgical History:  ?Procedure Laterality Date  ? BIOPSY  09/01/2021  ? Procedure: BIOPSY;  Surgeon: Marguerita Merles, Reuel Boom, MD;  Location: [facility];  Service: Gastroenterology;;  ? CERVICAL CERCLAGE    ? CESAREAN SECTION    ? COLONOSCOPY WITH PROPOFOL N/A 09/01/2021  ? Procedure: COLONOSCOPY WITH PROPOFOL;  Surgeon: Dolores Frame, MD;  Location: [facility];  Service: Gastroenterology;  Laterality: N/A;  10;45  ? ESOPHAGOGASTRODUODENOSCOPY (EGD) WITH PROPOFOL N/A 09/01/2021  ? Procedure: ESOPHAGOGASTRODUODENOSCOPY (EGD) WITH PROPOFOL;  Surgeon: Dolores Frame, MD;  Location: [facility];  Service: Gastroenterology;  Laterality: N/A;  ? GASTRIC BYPASS    ? right knee arthroscopy Right   ? ? ?Family History: ?Family History  ?Problem Relation Age of Onset  ? Colon cancer Neg Hx   ? ? ?Social History: ?Social History  ? ?Tobacco Use  ?Smoking Status Never  ?Smokeless Tobacco Never  ? ?Social History  ? ?Substance and Sexual Activity  ?Alcohol Use Not Currently  ? ?Social History  ? ?Substance and Sexual Activity  ?Drug Use Never  ? ? ?Allergies: ?Allergies  ?Allergen Reactions  ? Bactrim [Sulfamethoxazole-Trimethoprim]  Shortness Of Breath  ? Shellfish Allergy Anaphylaxis  ? Iodides Itching  ? Latex Itching  ? Morphine And Related Itching  ? Sulfa Antibiotics Itching  ? ? ?Medications: ?Current Outpatient Medications  ?Medication Sig Dispense Refill  ? alendronate  (FOSAMAX) 70 MG tablet Take 70 mg by mouth once a week. Take with a full glass of water on an empty stomach.    ? amLODipine (NORVASC) 5 MG tablet Take 5 mg by mouth daily.    ? aspirin EC 81 MG tablet Take 81 mg by mouth daily. Swallow whole.    ? atorvastatin (LIPITOR) 20 MG tablet Take 20 mg by mouth daily.    ? clotrimazole (LOTRIMIN) 1 % cream Apply 1 application topically 2 (two) times daily.    ? clotrimazole-betamethasone (LOTRISONE) cream Apply 1 application topically 2 (two) times daily.    ? conjugated estrogens (PREMARIN) vaginal cream Place 1 applicator vaginally 2 (two) times a week.    ? cyclobenzaprine (FLEXERIL) 5 MG tablet Take 5 mg by mouth at bedtime as needed for muscle spasms.    ? dicyclomine (BENTYL) 10 MG capsule Take 1 capsule (10 mg total) by mouth every 8 (eight) hours as needed for spasms (abdominal pain). 90 capsule 1  ? gabapentin (NEURONTIN) 300 MG capsule Take 300 mg by mouth 3 (three) times daily.    ? guaiFENesin (MUCINEX) 600 MG 12 hr tablet Take 600 mg by mouth 2 (two) times daily.    ? metoprolol tartrate (LOPRESSOR) 25 MG tablet Take 25 mg by mouth 2 (two) times daily.    ? omeprazole (PRILOSEC) 20 MG capsule Take 20 mg by mouth daily as needed (heartburn).    ? polyethylene glycol (MIRALAX / GLYCOLAX) 17 g packet Take 17 g by mouth daily as needed for mild constipation or moderate constipation.    ? valACYclovir (VALTREX) 500 MG tablet Take 500 mg by mouth daily as needed (flair up).    ? ?No current facility-administered medications for this visit.  ? ? ?Review of Systems: ?GENERAL: negative for malaise, night sweats ?HEENT: No changes in hearing or vision, no nose bleeds or other nasal problems. ?NECK: Negative for lumps, goiter, pain and significant neck swelling ?RESPIRATORY: Negative for cough, wheezing ?CARDIOVASCULAR: Negative for chest pain, leg swelling, palpitations, orthopnea ?GI: SEE HPI ?MUSCULOSKELETAL: Negative for joint pain or swelling, back pain, and muscle  pain. ?SKIN: Negative for lesions, rash ?PSYCH: Negative for sleep disturbance, mood disorder and recent psychosocial stressors. ?HEMATOLOGY Negative for prolonged bleeding, bruising easily, and swollen nodes. ?ENDOCRINE: Negative for cold or heat intolerance, polyuria, polydipsia and goiter. ?NEURO: negative for tremor, gait imbalance, syncope and seizures. ?The remainder of the review of systems is noncontributory. ? ? ?Physical Exam: ?BP 113/77 (BP Location: Left Arm, Patient Position: Sitting, Cuff Size: Large)   Pulse 76   Temp 98.2 ?F (36.8 ?C) (Oral)   Ht  (1.626 m)   Wt 210 lb 1.6 oz (95.3 kg)   BMI 36.06 kg/m?  ?GENERAL: The patient is AO x3, in no acute distress. Obese. ?HEENT: Head is normocephalic and atraumatic. EOMI are intact. Mouth is well hydrated and without lesions. ?NECK: Supple. No masses ?LUNGS: Clear to auscultation. No presence of rhonchi/wheezing/rales. Adequate chest expansion ?HEART: RRR, normal s1 and s2. ?ABDOMEN: tender to palpation diffusely but worse in epigastric and L flank area, no guarding, no peritoneal signs, and nondistended. BS +. No masses. ?EXTREMITIES: Without any cyanosis, clubbing, rash, lesions or edema. ?NEUROLOGIC: AOx3, no focal motor  deficit. ?SKIN: no jaundice, no rashes ? ?Imaging/Labs: ?as above ? ?I personally reviewed and interpreted the available labs, imaging and endoscopic files. ? ?Impression and Plan: ?April Kidd is a 56 y.o. female with PMH radiculopathy, obseitys /p RYGB in 2003, history of chronic pancreatitis due to chronic alcohol intake, chronic back pain, GERD, hypertension, osteoporosis, who presents for follow up of rectal pain and chronic abdominal pain.  The patient has presented intermittent episodes of burning pain and burning sensation in her abdomen of unclear etiology.  She has had endoscopic and imaging investigations in the last year that have not shown any major abnormalities to explain her symptoms.  Part of her  symptoms could be related to chronic pancreatitis as there is evidence of calcifications in her pancreas, but the rest of her biliary tree has been normal.  However, her father abdominal and rectal complaints ar

## 2022-01-13 DIAGNOSIS — M810 Age-related osteoporosis without current pathological fracture: Secondary | ICD-10-CM | POA: Diagnosis not present

## 2022-01-13 DIAGNOSIS — Z885 Allergy status to narcotic agent status: Secondary | ICD-10-CM | POA: Diagnosis not present

## 2022-01-13 DIAGNOSIS — Z87892 Personal history of anaphylaxis: Secondary | ICD-10-CM | POA: Diagnosis not present

## 2022-01-13 DIAGNOSIS — M549 Dorsalgia, unspecified: Secondary | ICD-10-CM | POA: Diagnosis not present

## 2022-01-13 DIAGNOSIS — Z883 Allergy status to other anti-infective agents status: Secondary | ICD-10-CM | POA: Diagnosis not present

## 2022-01-13 DIAGNOSIS — Z9049 Acquired absence of other specified parts of digestive tract: Secondary | ICD-10-CM | POA: Diagnosis not present

## 2022-01-13 DIAGNOSIS — N3090 Cystitis, unspecified without hematuria: Secondary | ICD-10-CM | POA: Diagnosis not present

## 2022-01-13 DIAGNOSIS — Z91013 Allergy to seafood: Secondary | ICD-10-CM | POA: Diagnosis not present

## 2022-01-13 DIAGNOSIS — N281 Cyst of kidney, acquired: Secondary | ICD-10-CM | POA: Diagnosis not present

## 2022-01-13 DIAGNOSIS — K8689 Other specified diseases of pancreas: Secondary | ICD-10-CM | POA: Diagnosis not present

## 2022-01-13 DIAGNOSIS — R197 Diarrhea, unspecified: Secondary | ICD-10-CM | POA: Diagnosis not present

## 2022-01-13 DIAGNOSIS — K861 Other chronic pancreatitis: Secondary | ICD-10-CM | POA: Diagnosis not present

## 2022-01-13 DIAGNOSIS — K219 Gastro-esophageal reflux disease without esophagitis: Secondary | ICD-10-CM | POA: Diagnosis not present

## 2022-01-13 DIAGNOSIS — I10 Essential (primary) hypertension: Secondary | ICD-10-CM | POA: Diagnosis not present

## 2022-01-13 DIAGNOSIS — G8929 Other chronic pain: Secondary | ICD-10-CM | POA: Diagnosis not present

## 2022-01-13 DIAGNOSIS — Z882 Allergy status to sulfonamides status: Secondary | ICD-10-CM | POA: Diagnosis not present

## 2022-01-20 DIAGNOSIS — K6289 Other specified diseases of anus and rectum: Secondary | ICD-10-CM | POA: Diagnosis not present

## 2022-01-20 DIAGNOSIS — M6289 Other specified disorders of muscle: Secondary | ICD-10-CM | POA: Diagnosis not present

## 2022-01-20 DIAGNOSIS — R197 Diarrhea, unspecified: Secondary | ICD-10-CM | POA: Diagnosis not present

## 2022-01-20 DIAGNOSIS — K921 Melena: Secondary | ICD-10-CM | POA: Diagnosis not present

## 2022-01-20 DIAGNOSIS — K625 Hemorrhage of anus and rectum: Secondary | ICD-10-CM | POA: Diagnosis not present

## 2022-04-04 ENCOUNTER — Ambulatory Visit (INDEPENDENT_AMBULATORY_CARE_PROVIDER_SITE_OTHER): Payer: Medicare Other | Admitting: Gastroenterology

## 2022-10-17 IMAGING — MG MM DIGITAL SCREENING BILAT W/ TOMO AND CAD
8 series · 8 of 24 positions shown · non-contrast
Comparison: None.

CLINICAL DATA: Screening.

EXAM:
DIGITAL SCREENING BILATERAL MAMMOGRAM WITH TOMOSYNTHESIS AND CAD
TECHNIQUE: Bilateral screening digital craniocaudal and mediolateral oblique
mammograms were obtained. Bilateral screening digital breast
tomosynthesis was performed. The images were evaluated with
computer-aided detection.

[L CC synth-2D]
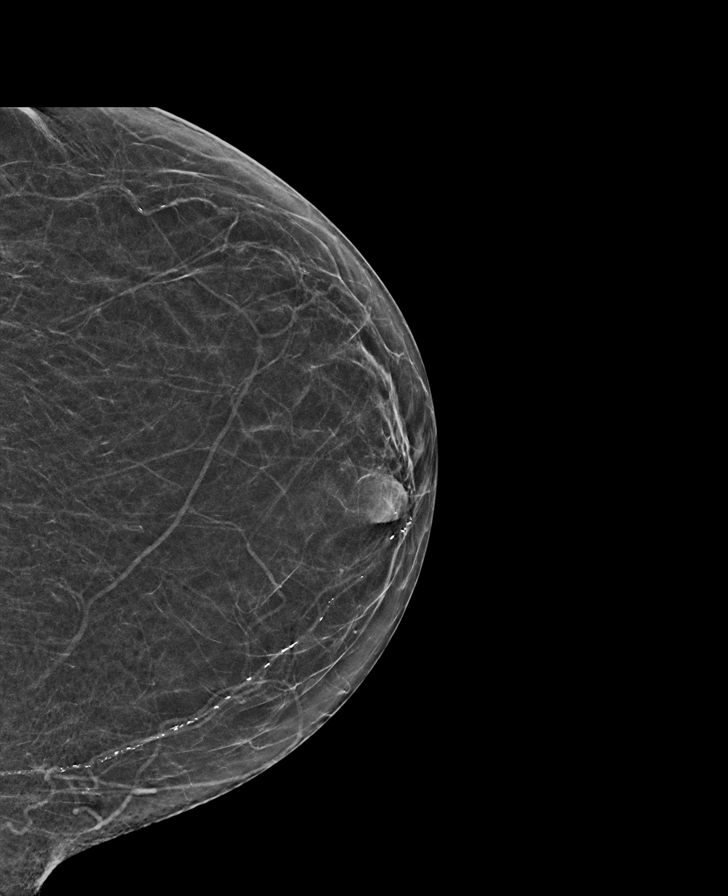

[L MLO synth-2D]
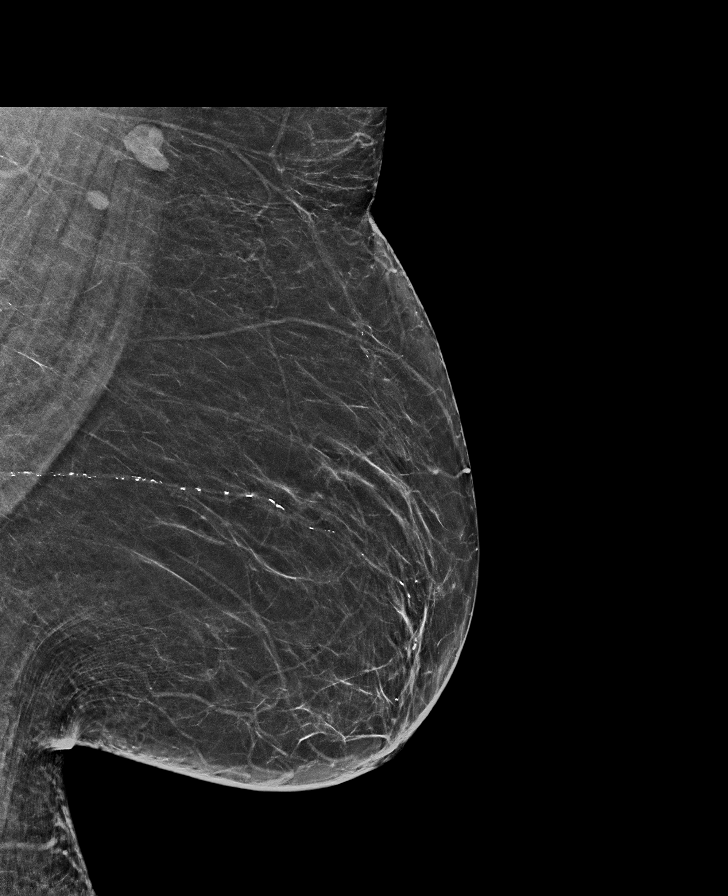

[R MLO synth-2D]
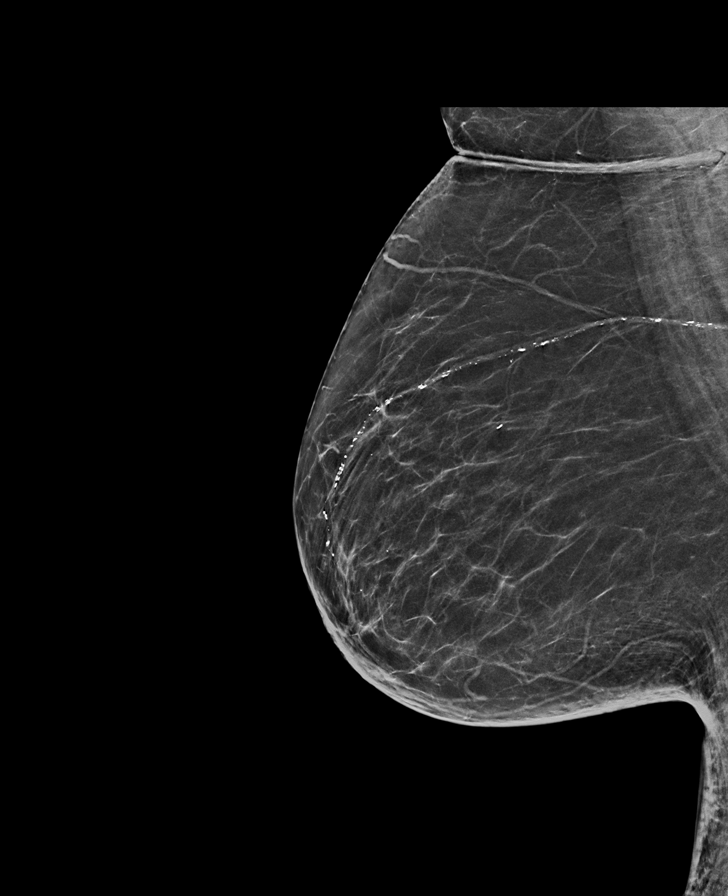

[R CC synth-2D]
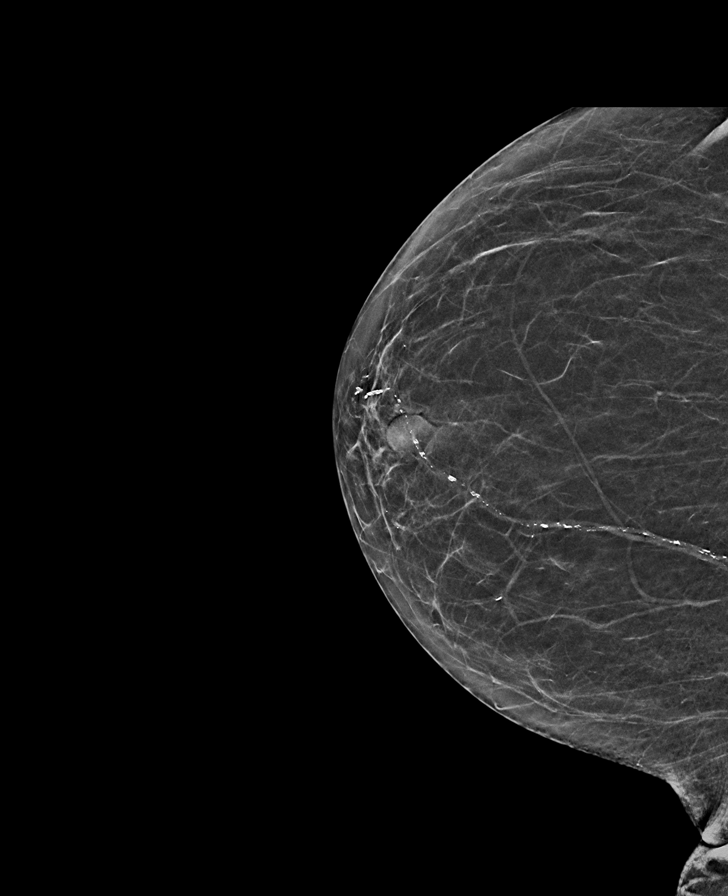

[L CC tomo · tomo slice 27/52.0]
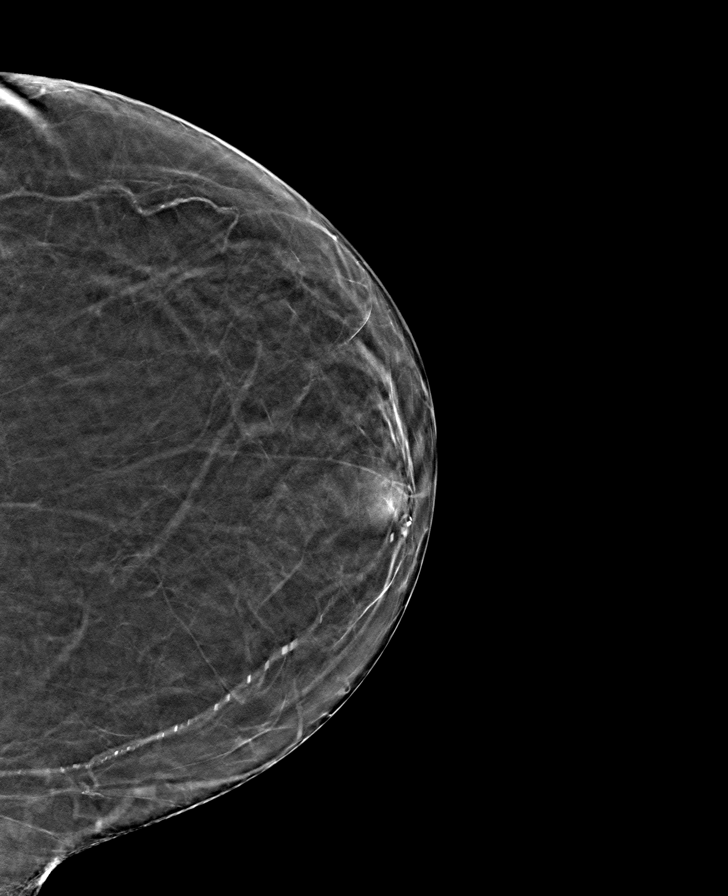

[R CC tomo · tomo slice 25/48.0]
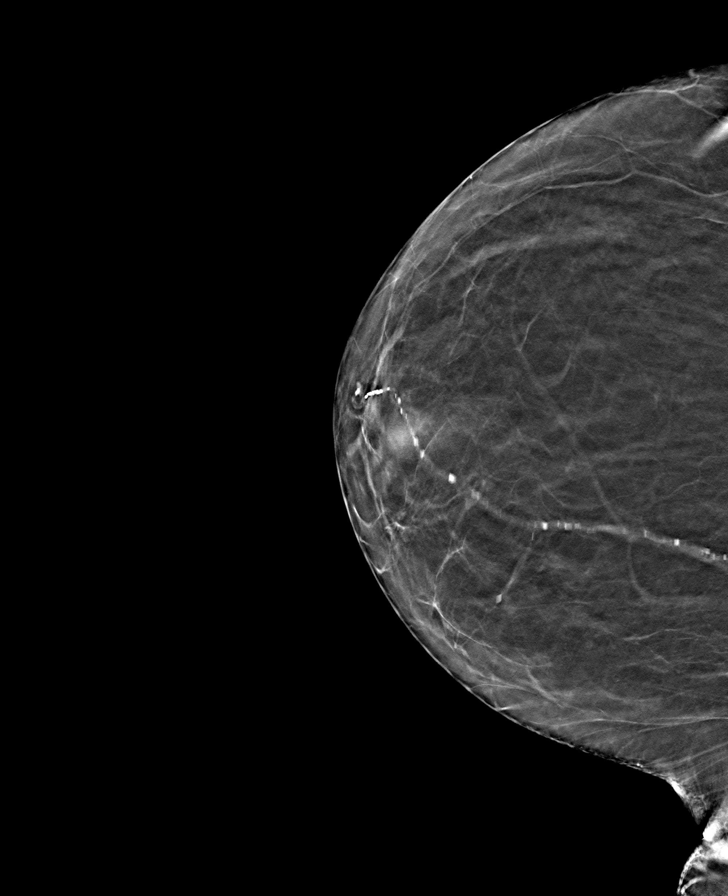

[L MLO tomo · tomo slice 33/66.0]
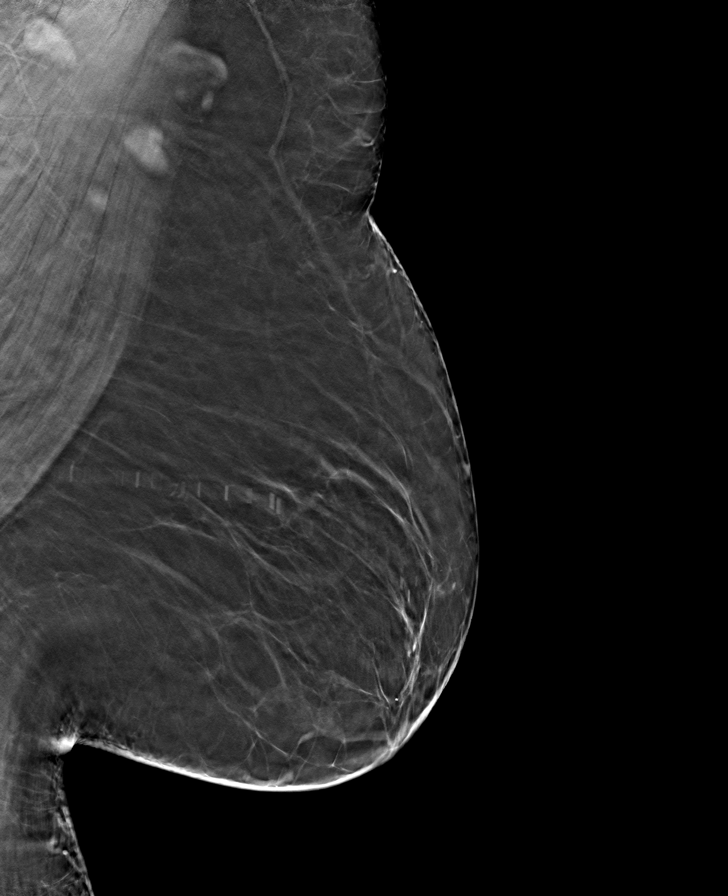

[R MLO tomo · tomo slice 30/59.0]
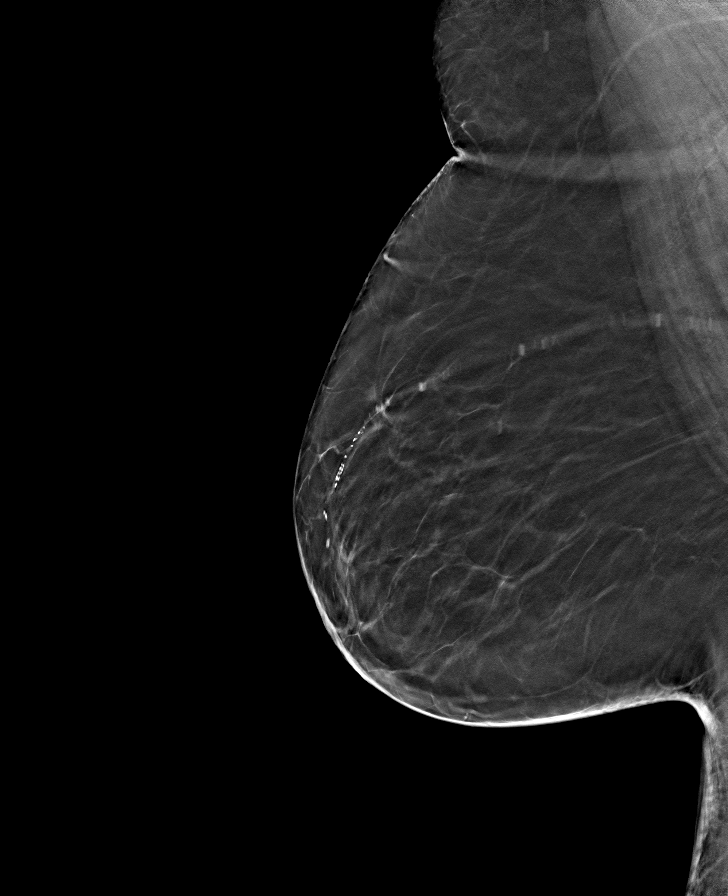

[8 of 24 positions shown; findings below may reference images not displayed]

ACR Breast Density Category b: There are scattered areas of
fibroglandular density.
FINDINGS: There are no findings suspicious for malignancy.
IMPRESSION: No mammographic evidence of malignancy. A result letter of this
screening mammogram will be mailed directly to the patient.

RECOMMENDATION:
Screening mammogram in one year. (Code:XG-X-X7B)

BI-RADS CATEGORY  1: Negative.
